# Patient Record
Sex: Female | Born: 1955 | Race: White | Hispanic: No | Smoking: Never smoker
Health system: Southern US, Community
[De-identification: ages and names within clinical notes are randomized; demographics above are authoritative.]

## PROBLEM LIST (undated history)

## (undated) DIAGNOSIS — E119 Type 2 diabetes mellitus without complications: Secondary | ICD-10-CM

## (undated) DIAGNOSIS — E7800 Pure hypercholesterolemia, unspecified: Secondary | ICD-10-CM

## (undated) DIAGNOSIS — I10 Essential (primary) hypertension: Secondary | ICD-10-CM

## (undated) DIAGNOSIS — M1990 Unspecified osteoarthritis, unspecified site: Secondary | ICD-10-CM

## (undated) DIAGNOSIS — F418 Other specified anxiety disorders: Secondary | ICD-10-CM

## (undated) HISTORY — DX: Other specified anxiety disorders: F41.8

## (undated) HISTORY — DX: Unspecified osteoarthritis, unspecified site: M19.90

## (undated) HISTORY — PX: TUBAL LIGATION: SHX77

---

## 2003-06-06 ENCOUNTER — Ambulatory Visit (HOSPITAL_COMMUNITY): Admission: RE | Admit: 2003-06-06 | Discharge: 2003-06-06 | Payer: Self-pay | Admitting: Family Medicine

## 2004-08-26 ENCOUNTER — Ambulatory Visit (HOSPITAL_COMMUNITY): Admission: RE | Admit: 2004-08-26 | Discharge: 2004-08-26 | Payer: Self-pay | Admitting: Urology

## 2004-10-06 ENCOUNTER — Ambulatory Visit (HOSPITAL_COMMUNITY): Admission: RE | Admit: 2004-10-06 | Discharge: 2004-10-06 | Payer: Self-pay | Admitting: Family Medicine

## 2008-09-04 ENCOUNTER — Ambulatory Visit (HOSPITAL_COMMUNITY): Admission: RE | Admit: 2008-09-04 | Discharge: 2008-09-04 | Payer: Self-pay | Admitting: Family Medicine

## 2010-09-11 ENCOUNTER — Other Ambulatory Visit (HOSPITAL_COMMUNITY): Payer: Self-pay | Admitting: Family Medicine

## 2010-09-11 DIAGNOSIS — Z139 Encounter for screening, unspecified: Secondary | ICD-10-CM

## 2010-09-15 ENCOUNTER — Ambulatory Visit (HOSPITAL_COMMUNITY): Payer: Self-pay

## 2010-09-19 ENCOUNTER — Ambulatory Visit (HOSPITAL_COMMUNITY): Payer: Self-pay

## 2010-11-21 NOTE — Consult Note (Signed)
NAME:  Ashley Oneal, Ashley Oneal NO.:  [id number]   MEDICAL RECORD NO.:  [id number]          PATIENT TYPE:   LOCATION:                                 FACILITY:   PHYSICIAN:  Ky Barban, M.D.    DATE OF BIRTH:   DATE OF CONSULTATION:  DATE OF DISCHARGE:                                   CONSULTATION   CHIEF COMPLAINT:  Stress urinary incontinence.   HISTORY OF PRESENT ILLNESS:  A 55 year old female who was evaluated by me on  January 26 with marked stress incontinence after she was referred by Dr.  Janna Arch.  She said that this incontinence is going on for the last one year  or more.  Also, there is urgency urge incontinence but no dysuria,  hematuria, fever or chills.  She has been taking Ditropan but without any  help.  A workup shows that she can benefit from bladder neck suspension.  I  advised her to undergo a tension-free vaginal tape.  I described the  procedure in detail, its limitation, complications, especially urinary  retention leading to removal of the tape.  She understands and wanted me to  go ahead and schedule.  I also told her the urgency will probably continue  and she will have to still take Ditropan.   PAST HISTORY:  1.  Tubal ligation.  2.  Ulcers.  3.  Hypertension.  4.  Suffers from depression.   MEDICATIONS:  Lotrel, Effexor, Ditropan XL.   PERSONAL HISTORY:  She does not smoke but drinks two cans of beer every  days.   REVIEW OF SYSTEMS:  Unremarkable.   PHYSICAL EXAMINATION:  VITAL SIGNS:  Blood pressure 112/94, temperature is  96.8.  CENTRAL NERVOUS SYSTEM:  No gross neurologic deficit.  HEAD AND NECK, ENT:  Negative.  CHEST:  Symmetrical.  CARDIAC:  Regular sinus rhythm, no murmur.  ABDOMEN:  Soft, flat.  Liver, spleen, kidneys not palpable.  No CVA  tenderness.  PELVIC:  No adnexal mass or tenderness.   IMPRESSION:  1.  Stress urinary incontinence.  2.  Hypertension.  3.  Depression.   PLAN:  Tension-free vaginal  tape under anesthesia as outpatient.      MIJ/MEDQ  D:  08/25/2004  T:  08/25/2004  Job:  [id number]   cc:   [facility]  Fax: [phone]

## 2010-11-21 NOTE — Op Note (Signed)
NAMEJALENA, Ashley Oneal NO.:  [id number]   MEDICAL RECORD NO.:  [id number]          PATIENT TYPE:  AMB   LOCATION:  DAY                           FACILITY:  [facility]   PHYSICIAN:  Ky Barban, M.D.DATE OF BIRTH:  March 01, 1956   DATE OF PROCEDURE:  08/26/2004  DATE OF DISCHARGE:                                 OPERATIVE REPORT   PREOPERATIVE DIAGNOSIS:  Stress urinary incontinence.   POSTOPERATIVE DIAGNOSES:  Stress urinary incontinence.   PROCEDURE:  Tension-free vaginal tape.   ANESTHESIA:  General.   PROCEDURE:  The patient was given general endotracheal anesthesia, placed in  lithotomy position. After prep and drape, #20 Foley catheter placed into the  bladder, and the suprapubic site at the pubic tubercle level was marked on  each side of the midline. The base of the mid urethra was infiltrated with  15 mL of normal saline, and incision right over the urethra ws made, ending  about 1.5 cm proximal to the meatus, and periurethral area was developed so  that it could accommodate the tip of the trocar. At this point, Foley  catheter was removed, and vaginal speculum was also removed, and #20 Foley  catheter with a trocar was introduced into the bladder deflecting the  bladder neck towards the right side. Trocar was introduced at the level of  the mid urethra on the left side, and it was passed into the retropubic  space, came out at the suprapubic area of previously marked site. Another  trocar was then placed. Foley catheter was removed. Bladder was inspected  with right angle lens to make sure the trocar was outside the bladder, and  the bladder was filled up with about 300 mL of saline. Now, the bladder was  emptied, and the trocar was pulled along with the blue cord in the  suprapubic area. The part of the blue cord was left on the left side, and  the extra part with the trocar was removed, and then the trocar was  reintroduced on the right side in the  same fashion, and in the same way, the  bladder was checked to make sure the trocar was outside the bladder. The  blue cord on the right side is pulled in the suprapubic area. Now I was  ready to insert the tape. The end of the tape was tied to the vaginal end of  the blue cord, and the tape ends pulled in the suprapubic area by pulling  the blue cord. A #20 Foley catheter was then placed. The tape was positioned  over the mid urethra, and it was left loose. Then, I introduced the Mayo  scissors in between the urethra and the tape. The plastic covering the tube  was separated, and the plastic sheath was removed gently, making sure the  tape was not tight, and I had already removed redundancy from the tape. The  tape was lying gently against the mid urethra. The vaginal wound was  irrigated and closed with 3 stitches  of 3-0 Vicryl. Once again, I inspected the bladder to make sure the tape  was  outside the bladder. At this point, the bladder was emptied. The redundant  part of the tape in the suprapubic area was cut, and the wound was simply  covered with a Band-Aid. The patient left the operating room in satisfactory  condition.      MIJ/MEDQ  D:  08/26/2004  T:  08/26/2004  Job:  [id number]

## 2011-11-18 ENCOUNTER — Telehealth: Payer: Self-pay | Admitting: *Deleted

## 2011-11-18 NOTE — Telephone Encounter (Signed)
Opened in error

## 2013-08-02 ENCOUNTER — Encounter (HOSPITAL_COMMUNITY): Payer: Self-pay | Admitting: Emergency Medicine

## 2013-08-02 ENCOUNTER — Emergency Department (HOSPITAL_COMMUNITY)
Admission: EM | Admit: 2013-08-02 | Discharge: 2013-08-03 | Disposition: A | Discharge status: Home / Self Care | Payer: Medicaid Other | Attending: Emergency Medicine | Admitting: Emergency Medicine

## 2013-08-02 ENCOUNTER — Emergency Department (HOSPITAL_COMMUNITY): Payer: Medicaid Other

## 2013-08-02 DIAGNOSIS — R42 Dizziness and giddiness: Secondary | ICD-10-CM | POA: Insufficient documentation

## 2013-08-02 DIAGNOSIS — Z862 Personal history of diseases of the blood and blood-forming organs and certain disorders involving the immune mechanism: Secondary | ICD-10-CM | POA: Insufficient documentation

## 2013-08-02 DIAGNOSIS — J069 Acute upper respiratory infection, unspecified: Secondary | ICD-10-CM | POA: Insufficient documentation

## 2013-08-02 DIAGNOSIS — R000 Tachycardia, unspecified: Secondary | ICD-10-CM | POA: Insufficient documentation

## 2013-08-02 DIAGNOSIS — Z8639 Personal history of other endocrine, nutritional and metabolic disease: Secondary | ICD-10-CM | POA: Insufficient documentation

## 2013-08-02 DIAGNOSIS — I10 Essential (primary) hypertension: Secondary | ICD-10-CM | POA: Insufficient documentation

## 2013-08-02 DIAGNOSIS — R112 Nausea with vomiting, unspecified: Secondary | ICD-10-CM | POA: Insufficient documentation

## 2013-08-02 HISTORY — DX: Essential (primary) hypertension: I10

## 2013-08-02 HISTORY — DX: Pure hypercholesterolemia, unspecified: E78.00

## 2013-08-02 LAB — PRO B NATRIURETIC PEPTIDE: Pro B Natriuretic peptide (BNP): 9 pg/mL (ref 0–125)

## 2013-08-02 LAB — CBC
HCT: 42.8 % (ref 36.0–46.0)
Hemoglobin: 14.3 g/dL (ref 12.0–15.0)
MCH: 30.8 pg (ref 26.0–34.0)
MCHC: 33.4 g/dL (ref 30.0–36.0)
MCV: 92 fL (ref 78.0–100.0)
Platelets: 299 10*3/uL (ref 150–400)
RBC: 4.65 MIL/uL (ref 3.87–5.11)
RDW: 12.9 % (ref 11.5–15.5)
WBC: 15.2 10*3/uL — ABNORMAL HIGH (ref 4.0–10.5)

## 2013-08-02 LAB — COMPREHENSIVE METABOLIC PANEL
ALT: 59 U/L — ABNORMAL HIGH (ref 0–35)
AST: 53 U/L — ABNORMAL HIGH (ref 0–37)
Albumin: 4.1 g/dL (ref 3.5–5.2)
Alkaline Phosphatase: 133 U/L — ABNORMAL HIGH (ref 39–117)
BUN: 19 mg/dL (ref 6–23)
CO2: 23 mEq/L (ref 19–32)
Calcium: 8.8 mg/dL (ref 8.4–10.5)
Chloride: 103 mEq/L (ref 96–112)
Creatinine, Ser: 0.86 mg/dL (ref 0.50–1.10)
GFR calc Af Amer: 84 mL/min — ABNORMAL LOW (ref >90)
GFR calc non Af Amer: 73 mL/min — ABNORMAL LOW (ref >90)
Glucose, Bld: 127 mg/dL — ABNORMAL HIGH (ref 70–99)
Potassium: 3.7 mEq/L (ref 3.7–5.3)
Sodium: 142 mEq/L (ref 137–147)
Total Bilirubin: 0.5 mg/dL (ref 0.3–1.2)
Total Protein: 7.4 g/dL (ref 6.0–8.3)

## 2013-08-02 LAB — PROTIME-INR
INR: 0.97 (ref 0.00–1.49)
Prothrombin Time: 12.7 seconds (ref 11.6–15.2)

## 2013-08-02 LAB — TROPONIN I: Troponin I: <0.3 ng/mL (ref <0.30)

## 2013-08-02 LAB — D-DIMER, QUANTITATIVE: D-Dimer, Quant: 0.46 ug/mL-FEU (ref 0.00–0.48)

## 2013-08-02 MED ORDER — SODIUM CHLORIDE 0.9 % IV SOLN: 1000.0000 mL | INTRAVENOUS | Status: DC

## 2013-08-02 MED ORDER — ASPIRIN 81 MG PO CHEW: 324.0000 mg | CHEWABLE_TABLET | Freq: Once | ORAL | Status: DC

## 2013-08-02 MED ORDER — SODIUM CHLORIDE 0.9 % IV SOLN
1000.0000 mL | Freq: Once | INTRAVENOUS | Status: AC
  Administered 2013-08-02: 1000 mL via INTRAVENOUS

## 2013-08-02 MED ORDER — GUAIFENESIN-CODEINE 100-10 MG/5ML PO SOLN: 5.0000 mL | Freq: Three times a day (TID) | ORAL | Status: AC | PRN

## 2013-08-02 MED ORDER — LEVOFLOXACIN 500 MG PO TABS: 500.0000 mg | ORAL_TABLET | Freq: Every day | ORAL | Status: AC

## 2013-08-02 MED ORDER — LEVOFLOXACIN 500 MG PO TABS
500.0000 mg | ORAL_TABLET | Freq: Once | ORAL | Status: AC
  Administered 2013-08-02: 500 mg via ORAL
  Filled 2013-08-02: qty 1

## 2013-08-02 MED ORDER — ASPIRIN 81 MG PO CHEW
324.0000 mg | CHEWABLE_TABLET | Freq: Once | ORAL | Status: AC
  Administered 2013-08-02: 324 mg via ORAL
  Filled 2013-08-02: qty 4

## 2013-08-02 NOTE — ED Notes (Signed)
Pt c/o central chest pain since 1600 today. Pt took tylenol and nitro was given enroute by ems.

## 2013-08-02 NOTE — ED Provider Notes (Signed)
CSN: [id number]     Arrival date & time 08/02/13  2109 History  This chart was scribed for Ashley Muskrat, MD by Zettie Pho, ED Scribe. This patient was seen in room [facility] and the patient's care was started at 9:29 PM.    Chief Complaint  Patient presents with  . Chest Pain   The history is provided by the patient. No language interpreter was used.   HPI Comments: Ashley Oneal is a 58 y.o. Female with a history of HTN and hypercholesterolemia brought in by EMS who presents to the Emergency Department complaining of an intermittent, sharp pain to the central region of the chest onset about 5.5 hours ago while at rest. Patient states that this type of pain is new for her. She denies any pain radiation. She reports some associated shortness of breath secondary to the pain and states that the pain is exacerbated with deep breathing. She reports some associated nausea, emesis, dizziness. Patient reports taking Tylenol at home without relief. Patient was given nitro en route by EMS. She denies any recent changes in medications. She denies syncope, abdominal pain, sore throat, difficulty speaking. Patient has no other pertinent medical history. Patient does not drink or smoke.   Past Medical History  Diagnosis Date  . Hypertension   . Hypercholesteremia    Past Surgical History  Procedure Laterality Date  . Tubal ligation     No family history on file. History  Substance Use Topics  . Smoking status: Never Smoker   . Smokeless tobacco: Not on file  . Alcohol Use: No   OB History   Grav Para Term Preterm Abortions TAB SAB Ect Mult Living                 Review of Systems  Constitutional:       Per HPI, otherwise negative  HENT: Negative for sore throat.        Per HPI, otherwise negative  Respiratory: Positive for shortness of breath.        Per HPI, otherwise negative  Cardiovascular: Positive for chest pain.       Per HPI, otherwise negative  Gastrointestinal: Positive  for nausea and vomiting. Negative for abdominal pain.  Endocrine:       Negative aside from HPI  Genitourinary:       Neg aside from HPI   Musculoskeletal:       Per HPI, otherwise negative  Skin: Negative.   Neurological: Positive for dizziness. Negative for syncope and speech difficulty.    Allergies  Review of patient's allergies indicates no known allergies.  Home Medications  No current outpatient prescriptions on file.  Triage Vitals: BP 105/56  Pulse 126  Temp(Src) 98.3 F (36.8 C)  Resp 20  Ht 5\' 2"  (1.575 m)  Wt 255 lb (115.667 kg)  BMI 46.63 kg/m2  SpO2 99%  Physical Exam  Nursing note and vitals reviewed. Constitutional: She is oriented to person, place, and time. She appears well-developed and well-nourished. No distress.  HENT:  Head: Normocephalic and atraumatic.  Eyes: Conjunctivae and EOM are normal.  Cardiovascular: Regular rhythm.   Tachycardic.   Pulmonary/Chest: Effort normal and breath sounds normal. No stridor. No respiratory distress. She exhibits tenderness.  Tenderness to palpation to the central chest.   Abdominal: She exhibits no distension. There is no tenderness. There is no rebound and no guarding.  Musculoskeletal: She exhibits no edema.  Neurological: She is alert and oriented to person, place, and time.  No cranial nerve deficit.  Skin: Skin is warm and dry.  Psychiatric: She has a normal mood and affect.    ED Course  Procedures (including critical care time)  COORDINATION OF CARE: 9:32 PM- Will order blood labs, EKG, and a chest x-ray. Will order IV fluids and aspirin to manage symptoms. Discussed treatment plan with patient at bedside and patient verbalized agreement.     Labs Review Labs Reviewed  CBC - Abnormal; Notable for the following:    WBC 15.2 (*)    All other components within normal limits  COMPREHENSIVE METABOLIC PANEL - Abnormal; Notable for the following:    Glucose, Bld 127 (*)    AST 53 (*)    ALT 59 (*)     Alkaline Phosphatase 133 (*)    GFR calc non Af Amer 73 (*)    GFR calc Af Amer 84 (*)    All other components within normal limits  PRO B NATRIURETIC PEPTIDE  PROTIME-INR  D-DIMER, QUANTITATIVE  TROPONIN I   Imaging Review Dg Chest 2 View  08/02/2013   CLINICAL DATA:  Chest pain, shortness of breath  EXAM: CHEST  2 VIEW  COMPARISON:  None.  FINDINGS: Minimal lung base opacities. Cardiomediastinal contours within normal range. No pleural effusion or pneumothorax. No acute osseous finding.  IMPRESSION: Mild lung base opacities, favor atelectasis.   Electronically Signed   By: Carlos Levering M.D.   On: 08/02/2013 22:41     Date: 08/02/2013  Rate: 118  Rhythm: sinus tachycardia  QRS Axis: normal  Intervals: normal  ST/T Wave abnormalities: artefact  Conduction Disutrbances:none  Narrative Interpretation:   Old EKG Reviewed: none available  ABNORMAL   11:34 PM Patient remains tachycardic, 105, though she appears better. Had a lengthy conversation with her and her companion about results.  Given the leukocytosis, abnormal chest x-ray, tachycardia, mild cough, there is some suspicion that this is early presentation of pneumonia versus viral URI.  Patient will be started on empiric antibiotics for presumed pneumonia given the aforementioned findings. MDM    I personally performed the services described in this documentation, which was scribed in my presence. The recorded information has been reviewed and is accurate.   This patient presents with several hours of chest discomfort, cough, fatigue.  With the duration of symptoms, the patient's normal troponin, in addition to the unremarkable EKG is reassuring for the low suspicion of ongoing coronary ischemia.  Patient has negative d-dimer. With leukocytosis, tachycardia, cough, there suspicion for infection.  Patient is awake, alert, oriented.  Given this, her generally good health prior to this illness, she was stable for discharge  after initiation of antibiotic therapy. She has a primary care physician whom she will contact tomorrow to insure appropriate ongoing management.     Ashley Muskrat, MD 08/02/13 [phone]

## 2013-08-02 NOTE — Discharge Instructions (Signed)
As discussed, your evaluation tonight is most consistent with an episode of respiratory infection, possibly early pneumonia.   It is important that you continue to have your condition monitored and managed by your primary care physician.  Please take medication as directed, and call tomorrow to arrange appropriate ongoing followup care.  Return here for concerning changes in your condition.

## 2016-11-07 ENCOUNTER — Encounter (HOSPITAL_COMMUNITY): Payer: Self-pay | Admitting: *Deleted

## 2016-11-07 ENCOUNTER — Emergency Department (HOSPITAL_COMMUNITY)
Admission: EM | Admit: 2016-11-07 | Discharge: 2016-11-07 | Disposition: A | Discharge status: Home / Self Care | Payer: BLUE CROSS/BLUE SHIELD | Attending: Emergency Medicine | Admitting: Emergency Medicine

## 2016-11-07 DIAGNOSIS — I10 Essential (primary) hypertension: Secondary | ICD-10-CM | POA: Insufficient documentation

## 2016-11-07 DIAGNOSIS — T63441A Toxic effect of venom of bees, accidental (unintentional), initial encounter: Secondary | ICD-10-CM | POA: Insufficient documentation

## 2016-11-07 DIAGNOSIS — E119 Type 2 diabetes mellitus without complications: Secondary | ICD-10-CM | POA: Diagnosis not present

## 2016-11-07 DIAGNOSIS — Z79899 Other long term (current) drug therapy: Secondary | ICD-10-CM | POA: Insufficient documentation

## 2016-11-07 DIAGNOSIS — R21 Rash and other nonspecific skin eruption: Secondary | ICD-10-CM | POA: Diagnosis present

## 2016-11-07 HISTORY — DX: Type 2 diabetes mellitus without complications: E11.9

## 2016-11-07 MED ORDER — DIPHENHYDRAMINE HCL 25 MG PO CAPS: 25.0000 mg | ORAL_CAPSULE | Freq: Four times a day (QID) | ORAL | 0 refills | Status: AC | PRN

## 2016-11-07 MED ORDER — IBUPROFEN 600 MG PO TABS: 600.0000 mg | ORAL_TABLET | Freq: Four times a day (QID) | ORAL | 0 refills | Status: AC | PRN

## 2016-11-07 MED ORDER — DIPHENHYDRAMINE HCL 25 MG PO CAPS
25.0000 mg | ORAL_CAPSULE | Freq: Once | ORAL | Status: AC
  Administered 2016-11-07: 25 mg via ORAL

## 2016-11-07 NOTE — Discharge Instructions (Signed)
I recommend taking benadryl and ibuprofen as prescribed for your allergic reaction.  Ice pack application can also help with pain and itching.

## 2016-11-07 NOTE — ED Notes (Signed)
JI in to assess 

## 2016-11-07 NOTE — ED Triage Notes (Signed)
Pt reports pain, redness, and selling to right lower forearm. Pt also reports itching to the site.

## 2016-11-07 NOTE — ED Notes (Signed)
Pt reports recent bee sting- awakened this am with swelling and redness, itching and burning to area near bee sting. Pt reports has never reacted like this before.

## 2016-11-09 NOTE — ED Provider Notes (Signed)
[facility] DEPT Provider Note   CSN: [id number] Arrival date & time: 11/07/16  2120     History   Chief Complaint Chief Complaint  Patient presents with  . Rash    HPI Ashley Oneal is a 61 y.o. female with a history of borderline DM, diet controlled, and HTN presenting with pain, swelling and itching at the site of a bee sting which occurred yesterday.  She was sitting on the front porch when she was bit by a yellow insect, unsure if it was a bee, wasp or yellow jacket. However, denies h/o allergic reaction to these insects, but does not typically react with such a large area of redness and swelling as currently.  She denies sob, cough, wheezing, mouth,tongue or throat tingling or swelling. She has had no treatment prior to arrival.  HPI  Past Medical History:  Diagnosis Date  . Diabetes mellitus without complication (HCC)    borderline  . Hypercholesteremia   . Hypertension     There are no active problems to display for this patient.   Past Surgical History:  Procedure Laterality Date  . TUBAL LIGATION      OB History    No data available       Home Medications    Prior to Admission medications   Medication Sig Start Date End Date Taking? Authorizing Provider  acetaminophen (TYLENOL) 500 MG tablet Take 500 mg by mouth every 6 (six) hours as needed.    [provider]  amLODipine-benazepril (LOTREL) 10-20 MG per capsule Take 1 capsule by mouth daily.    [provider]  colesevelam (WELCHOL) 625 MG tablet Take 1,875 mg by mouth 2 (two) times daily with a meal.    [provider]  diphenhydrAMINE (BENADRYL) 25 mg capsule Take 1 capsule (25 mg total) by mouth every 6 (six) hours as needed for itching. 11/07/16   Evalee Jefferson, PA-C  guaiFENesin-codeine 100-10 MG/5ML syrup Take 5 mLs by mouth 3 (three) times daily as needed for cough. 08/02/13   Carmin Muskrat, MD  hydrOXYzine (ATARAX/VISTARIL) 25 MG tablet Take 25 mg by mouth 2 (two)  times daily.    [provider]  ibuprofen (ADVIL,MOTRIN) 600 MG tablet Take 1 tablet (600 mg total) by mouth every 6 (six) hours as needed. 11/07/16   Evalee Jefferson, PA-C  meloxicam (MOBIC) 15 MG tablet Take 15 mg by mouth daily.    [provider]    Family History History reviewed. No pertinent family history.  Social History Social History  Substance Use Topics  . Smoking status: Never Smoker  . Smokeless tobacco: Never Used  . Alcohol use No     Allergies   Patient has no known allergies.   Review of Systems Review of Systems  Constitutional: Negative for chills and fever.  HENT: Negative for congestion.   Respiratory: Negative for cough, shortness of breath, wheezing and stridor.   Skin: Positive for color change and wound.  Neurological: Negative for numbness.     Physical Exam Updated Vital Signs BP 113/78 (BP Location: Left Arm)   Pulse 92   Temp 98.2 F (36.8 C) (Oral)   Resp 20   Ht 5' (1.524 m)   Wt 93.9 kg   SpO2 95%   BMI 40.43 kg/m   Physical Exam  Constitutional: She appears well-developed and well-nourished. No distress.  HENT:  Head: Normocephalic.  Mouth/Throat: Oropharynx is clear and moist.  Neck: Neck supple.  Cardiovascular: Normal rate.   Pulmonary/Chest:  Effort normal. No stridor. She has no wheezes.  Musculoskeletal: Normal range of motion. She exhibits no edema.  Lymphadenopathy:    She has no axillary adenopathy.       Right axillary: No pectoral and no lateral adenopathy present.       Right: No epitrochlear adenopathy present.  Skin: There is erythema.  6 cm area of slightly raised erythematous plaque right medial forearm with clearly demarcated borders with a small excoriated bite site.  No fb, no red streaking. Slightly increased warmth but not hot to touch. Forearm is soft.     ED Treatments / Results  Labs (all labs ordered are listed, but only abnormal results are displayed) Labs Reviewed - No data to  display  EKG  EKG Interpretation None       Radiology No results found.  Procedures Procedures (including critical care time)  Medications Ordered in ED Medications  diphenhydrAMINE (BENADRYL) capsule 25 mg (25 mg Oral Given 11/07/16 2239)     Initial Impression / Assessment and Plan / ED Course  I have reviewed the triage vital signs and the nursing notes.  Pertinent labs & imaging results that were available during my care of the patient were reviewed by me and considered in my medical decision making (see chart for details).     Pt with localized reaction to insect sting.  Cool compresses, benadryl, ibuprofen advised.  Prn f/u with pcp for any persistent sx.  No evidence of infection or anaphylactic reaction.  Final Clinical Impressions(s) / ED Diagnoses   Final diagnoses:  Bee sting reaction, accidental or unintentional, initial encounter    New Prescriptions Discharge Medication List as of 11/07/2016 10:35 PM    START taking these medications   Details  diphenhydrAMINE (BENADRYL) 25 mg capsule Take 1 capsule (25 mg total) by mouth every 6 (six) hours as needed for itching., Starting Sat 11/07/2016, Print    ibuprofen (ADVIL,MOTRIN) 600 MG tablet Take 1 tablet (600 mg total) by mouth every 6 (six) hours as needed., Starting Sat 11/07/2016, Print         Evalee Jefferson, PA-C 11/09/16 1236    Long, [facility], MD 11/10/16 1135

## 2019-11-20 ENCOUNTER — Other Ambulatory Visit (HOSPITAL_COMMUNITY): Payer: Self-pay | Admitting: Family Medicine

## 2019-11-20 DIAGNOSIS — Z1231 Encounter for screening mammogram for malignant neoplasm of breast: Secondary | ICD-10-CM

## 2019-11-27 ENCOUNTER — Other Ambulatory Visit: Payer: Self-pay

## 2019-11-27 ENCOUNTER — Ambulatory Visit (HOSPITAL_COMMUNITY)
Admission: RE | Admit: 2019-11-27 | Discharge: 2019-11-27 | Disposition: A | Discharge status: Home / Self Care | Payer: Medicare Other | Source: Ambulatory Visit | Attending: Family Medicine | Admitting: Family Medicine

## 2019-11-27 DIAGNOSIS — Z1231 Encounter for screening mammogram for malignant neoplasm of breast: Secondary | ICD-10-CM | POA: Diagnosis not present

## 2019-11-29 ENCOUNTER — Encounter: Payer: Self-pay | Admitting: *Deleted

## 2020-02-06 ENCOUNTER — Telehealth: Payer: Self-pay | Admitting: *Deleted

## 2020-02-06 ENCOUNTER — Encounter: Payer: Self-pay | Admitting: *Deleted

## 2020-02-06 NOTE — Telephone Encounter (Signed)
Mailed letter to pt to inform her that her appointment had to be rescheduled.

## 2020-02-12 ENCOUNTER — Ambulatory Visit: Payer: Medicare Other

## 2020-03-12 ENCOUNTER — Ambulatory Visit (INDEPENDENT_AMBULATORY_CARE_PROVIDER_SITE_OTHER): Payer: Self-pay | Admitting: *Deleted

## 2020-03-12 ENCOUNTER — Other Ambulatory Visit: Payer: Self-pay

## 2020-03-12 DIAGNOSIS — Z1211 Encounter for screening for malignant neoplasm of colon: Secondary | ICD-10-CM

## 2020-03-12 MED ORDER — PEG 3350-KCL-NA BICARB-NACL 420 G PO SOLR: 4000.0000 mL | Freq: Once | ORAL | 0 refills | Status: AC

## 2020-03-12 NOTE — Progress Notes (Signed)
Patient's BMI is 40.5. Technically that makes her ASA III. Based on guidelines she needs ov.

## 2020-03-12 NOTE — Progress Notes (Signed)
Gastroenterology Pre-Procedure Review  Request Date: 03/12/2020 Requesting Physician: Dr. Cindie Laroche, no previous TCS  PATIENT REVIEW QUESTIONS: The patient responded to the following health history questions as indicated:    1. Diabetes Melitis: no 2. Joint replacements in the past 12 months: no 3. Major health problems in the past 3 months: no 4. Has an artificial valve or MVP: no 5. Has a defibrillator: no 6. Has been advised in past to take antibiotics in advance of a procedure like teeth cleaning: no 7. Family history of colon cancer: yes, grandmother: age unknown 24. Alcohol Use: yes, 1 beer daily 9. Illicit drug Use: no 10. History of sleep apnea: no  11. History of coronary artery or other vascular stents placed within the last 12 months: no 12. History of any prior anesthesia complications: no 13. There is no height or weight on file to calculate BMI. ht: 4'10 wt: 194 lbs    MEDICATIONS & ALLERGIES:    Patient reports the following regarding taking any blood thinners:   Plavix? no Aspirin? no Coumadin? no Brilinta? no Xarelto? no Eliquis? no Pradaxa? no Savaysa? no Effient? no  Patient confirms/reports the following medications:  Current Outpatient Medications  Medication Sig Dispense Refill  . acetaminophen (TYLENOL) 500 MG tablet Take 500 mg by mouth as needed.     Marland Kitchen amLODipine-benazepril (LOTREL) 10-20 MG per capsule Take 1 capsule by mouth daily.    . Cholecalciferol (VITAMIN D3) 10 MCG (400 UNIT) CAPS Take by mouth daily.    . diphenhydrAMINE (BENADRYL) 25 mg capsule Take 1 capsule (25 mg total) by mouth every 6 (six) hours as needed for itching. (Patient taking differently: Take 25 mg by mouth as needed for itching. ) 30 capsule 0  . guaiFENesin-codeine 100-10 MG/5ML syrup Take 5 mLs by mouth 3 (three) times daily as needed for cough. 120 mL 0  . hydrOXYzine (ATARAX/VISTARIL) 25 MG tablet Take 25 mg by mouth 2 (two) times daily.    Marland Kitchen ibuprofen (ADVIL,MOTRIN) 600 MG  tablet Take 1 tablet (600 mg total) by mouth every 6 (six) hours as needed. (Patient taking differently: Take 600 mg by mouth as needed. ) 30 tablet 0  . Melatonin 10 MG TABS Take by mouth at bedtime.    . meloxicam (MOBIC) 15 MG tablet Take 15 mg by mouth daily.    Marland Kitchen PARoxetine (PAXIL) 20 MG tablet Take 20 mg by mouth every morning.    . simvastatin (ZOCOR) 10 MG tablet Take 10 mg by mouth daily.    . colesevelam (WELCHOL) 625 MG tablet Take 1,875 mg by mouth 2 (two) times daily with a meal. (Patient not taking: Reported on 03/12/2020)     No current facility-administered medications for this visit.    Patient confirms/reports the following allergies:  No Known Allergies  No orders of the defined types were placed in this encounter.   AUTHORIZATION INFORMATION Primary Insurance: [employer],  ID #: [id number],  Group #: [id number] Pre-Cert / Auth required: No, not required  SCHEDULE INFORMATION: Procedure has been scheduled as follows:  Date: 04/08/2020, Time: 7:30 Location: [facility] with Dr. Abbey Chatters  This Gastroenterology Pre-Precedure Review Form is being routed to the following provider(s): Neil Crouch, PA

## 2020-03-12 NOTE — Patient Instructions (Signed)
Ashley Oneal   04-12-1956 MRN: [id number]    Procedure Date: 04/08/2020 Time to register: 6:00 am Place to register: [facility] Procedure Time: 7:30 am Scheduled provider: Dr. Abbey Chatters  PREPARATION FOR COLONOSCOPY WITH TRI-LYTE SPLIT PREP  Please notify us immediately if you are diabetic, take iron supplements, or if you are on Coumadin or any other blood thinners.   Please hold the following medications: n/a  You will need to purchase 1 fleet enema and 1 box of Bisacodyl $RemoveBefo'5mg'nZumyamttDo$  tablets.   2 DAYS BEFORE PROCEDURE:  DATE: 04/06/2020   DAY: Saturday Begin clear liquid diet AFTER your lunch meal. NO SOLID FOODS after this point.  1 DAY BEFORE PROCEDURE:  DATE: 04/07/2020   DAY: Sunday Continue clear liquids the entire day - NO SOLID FOOD.   Diabetic medications adjustments for today: n/a  At 2:00 pm:  Take 2 Bisacodyl tablets.   At 4:00pm:  Start drinking your solution. Make sure you mix well per instructions on the bottle. Try to drink 1 (one) 8 ounce glass every 10-15 minutes until you have consumed HALF the jug. You should complete by 6:00pm.You must keep the left over solution refrigerated until completed next day.  Continue clear liquids. You must drink plenty of clear liquids to prevent dehyration and kidney failure.     DAY OF PROCEDURE:   DATE: 04/08/2020   DAY: Monday If you take medications for your heart, blood pressure or breathing, you may take these medications.  Diabetic medications adjustments for today: n/a  Five hours before your procedure time @ 2:30 am:  Finish remaining amout of bowel prep, drinking 1 (one) 8 ounce glass every 10-15 minutes until complete. You have two hours to consume remaining prep.   Three hours before your procedure time @ 4:30 am:  Nothing by mouth.   At least one hour before going to the hospital:  Give yourself one Fleet enema. You may take your morning medications with sip of water unless we have instructed otherwise.       Please see below for Dietary Information.  CLEAR LIQUIDS INCLUDE:  Water Jello (NOT red in color)   Ice Popsicles (NOT red in color)   Tea (sugar ok, no milk/cream) Powdered fruit flavored drinks  Coffee (sugar ok, no milk/cream) Gatorade/ Lemonade/ Kool-Aid  (NOT red in color)   Juice: apple, white grape, white cranberry Soft drinks  Clear bullion, consomme, broth (fat free beef/chicken/vegetable)  Carbonated beverages (any kind)  Strained chicken noodle soup Hard Candy   Remember: Clear liquids are liquids that will allow you to see your fingers on the other side of a clear glass. Be sure liquids are NOT red in color, and not cloudy, but CLEAR.  DO NOT EAT OR DRINK ANY OF THE FOLLOWING:  Dairy products of any kind   Cranberry juice Tomato juice / V8 juice   Grapefruit juice Orange juice     Red grape juice  Do not eat any solid foods, including such foods as: cereal, oatmeal, yogurt, fruits, vegetables, creamed soups, eggs, bread, crackers, pureed foods in a blender, etc.   HELPFUL HINTS FOR DRINKING PREP SOLUTION:   Make sure prep is extremely cold. Mix and refrigerate the the morning of the prep. You may also put in the freezer.   You may try mixing some Crystal Light or Country Time Lemonade if you prefer. Mix in small amounts; add more if necessary.  Try drinking through a straw  Rinse mouth with water  or a mouthwash between glasses, to remove after-taste.  Try sipping on a cold beverage /ice/ popsicles between glasses of prep.  Place a piece of sugar-free hard candy in mouth between glasses.  If you become nauseated, try consuming smaller amounts, or stretch out the time between glasses. Stop for 30-60 minutes, then slowly start back drinking.        OTHER INSTRUCTIONS  You will need a responsible adult at least 65 years of age to accompany you and drive you home. This person must remain in the waiting room during your procedure. The hospital will cancel  your procedure if you do not have a responsible adult with you.   1. Wear loose fitting clothing that is easily removed. 2. Leave jewelry and other valuables at home.  3. Remove all body piercing jewelry and leave at home. 4. Total time from sign-in until discharge is approximately 2-3 hours. 5. You should go home directly after your procedure and rest. You can resume normal activities the day after your procedure. 6. The day of your procedure you should not:  Drive  Make legal decisions  Operate machinery  Drink alcohol  Return to work   You may call the office (Dept: [phone]) before 5:00pm, or page the doctor on call [phone]) after 5:00pm, for further instructions, if necessary.   Insurance Information YOU WILL NEED TO CHECK WITH YOUR INSURANCE COMPANY FOR THE BENEFITS OF COVERAGE YOU HAVE FOR THIS PROCEDURE.  UNFORTUNATELY, NOT ALL INSURANCE COMPANIES HAVE BENEFITS TO COVER ALL OR PART OF THESE TYPES OF PROCEDURES.  IT IS YOUR RESPONSIBILITY TO CHECK YOUR BENEFITS, HOWEVER, WE WILL BE GLAD TO ASSIST YOU WITH ANY CODES YOUR INSURANCE COMPANY MAY NEED.    PLEASE NOTE THAT MOST INSURANCE COMPANIES WILL NOT COVER A SCREENING COLONOSCOPY FOR PEOPLE UNDER THE AGE OF 50  IF YOU HAVE [employer] INSURANCE, YOU MAY HAVE BENEFITS FOR A SCREENING COLONOSCOPY BUT IF POLYPS ARE FOUND THE DIAGNOSIS WILL CHANGE AND THEN YOU MAY HAVE A DEDUCTIBLE THAT WILL NEED TO BE MET. SO PLEASE MAKE SURE YOU CHECK YOUR BENEFITS FOR A SCREENING COLONOSCOPY AS WELL AS A DIAGNOSTIC COLONOSCOPY.

## 2020-03-13 NOTE — Progress Notes (Signed)
Called pt and informed her that we needed to cancel her procedure.  She was informed that she needed an ov with a provider first.  Pt scheduled ov for 03/18/2020 at 2:00 with Aliene Altes, PA.

## 2020-03-14 ENCOUNTER — Ambulatory Visit: Payer: Medicare Other

## 2020-03-16 NOTE — Progress Notes (Deleted)
Referring Provider:*** Primary Care Physician:  Joyice Faster, FNP Primary Gastroenterologist:  Dr. Abbey Chatters  No chief complaint on file.   HPI:   Ashley Oneal is a 64 y.o. female presenting today at the request of Lucia Gaskins, MD for colonoscopy.  She was revealing in her triage visit but due to BMI meeting ASA 3 guidelines, patient was scheduled for office visit.  Today:   Past Medical History:  Diagnosis Date  . Diabetes mellitus without complication (HCC)    borderline  . Hypercholesteremia   . Hypertension     Past Surgical History:  Procedure Laterality Date  . TUBAL LIGATION      Current Outpatient Medications  Medication Sig Dispense Refill  . acetaminophen (TYLENOL) 500 MG tablet Take 500 mg by mouth as needed.     Marland Kitchen amLODipine-benazepril (LOTREL) 10-20 MG per capsule Take 1 capsule by mouth daily.    . Cholecalciferol (VITAMIN D3) 10 MCG (400 UNIT) CAPS Take by mouth daily.    . colesevelam (WELCHOL) 625 MG tablet Take 1,875 mg by mouth 2 (two) times daily with a meal. (Patient not taking: Reported on 03/12/2020)    . diphenhydrAMINE (BENADRYL) 25 mg capsule Take 1 capsule (25 mg total) by mouth every 6 (six) hours as needed for itching. (Patient taking differently: Take 25 mg by mouth as needed for itching. ) 30 capsule 0  . guaiFENesin-codeine 100-10 MG/5ML syrup Take 5 mLs by mouth 3 (three) times daily as needed for cough. 120 mL 0  . hydrOXYzine (ATARAX/VISTARIL) 25 MG tablet Take 25 mg by mouth 2 (two) times daily.    Marland Kitchen ibuprofen (ADVIL,MOTRIN) 600 MG tablet Take 1 tablet (600 mg total) by mouth every 6 (six) hours as needed. (Patient taking differently: Take 600 mg by mouth as needed. ) 30 tablet 0  . Melatonin 10 MG TABS Take by mouth at bedtime.    . meloxicam (MOBIC) 15 MG tablet Take 15 mg by mouth daily.    Marland Kitchen PARoxetine (PAXIL) 20 MG tablet Take 20 mg by mouth every morning.    . simvastatin (ZOCOR) 10 MG tablet Take 10 mg by mouth daily.       No current facility-administered medications for this visit.    Allergies as of 03/18/2020  . (No Known Allergies)    No family history on file.  Social History   Socioeconomic History  . Marital status: Divorced    Spouse name: Not on file  . Number of children: Not on file  . Years of education: Not on file  . Highest education level: Not on file  Occupational History  . Not on file  Tobacco Use  . Smoking status: Never Smoker  . Smokeless tobacco: Never Used  Vaping Use  . Vaping Use: Never used  Substance and Sexual Activity  . Alcohol use: Yes    Comment: 1 beer daily  . Drug use: No  . Sexual activity: Not on file  Other Topics Concern  . Not on file  Social History Narrative  . Not on file   Social Determinants of Health   Financial Resource Strain:   . Difficulty of Paying Living Expenses: Not on file  Food Insecurity:   . Worried About Charity fundraiser in the Last Year: Not on file  . Ran Out of Food in the Last Year: Not on file  Transportation Needs:   . Lack of Transportation (Medical): Not on file  . Lack of Transportation (Non-Medical):  Not on file  Physical Activity:   . Days of Exercise per Week: Not on file  . Minutes of Exercise per Session: Not on file  Stress:   . Feeling of Stress : Not on file  Social Connections:   . Frequency of Communication with Friends and Family: Not on file  . Frequency of Social Gatherings with Friends and Family: Not on file  . Attends Religious Services: Not on file  . Active Member of Clubs or Organizations: Not on file  . Attends Archivist Meetings: Not on file  . Marital Status: Not on file  Intimate Partner Violence:   . Fear of Current or Ex-Partner: Not on file  . Emotionally Abused: Not on file  . Physically Abused: Not on file  . Sexually Abused: Not on file    Review of Systems: Gen: Denies any fever, chills, fatigue, weight loss, lack of appetite.  CV: Denies chest pain, heart  palpitations, peripheral edema, syncope.  Resp: Denies shortness of breath at rest or with exertion. Denies wheezing or cough.  GI: Denies dysphagia or odynophagia. Denies jaundice, hematemesis, fecal incontinence. GU : Denies urinary burning, urinary frequency, urinary hesitancy MS: Denies joint pain, muscle weakness, cramps, or limitation of movement.  Derm: Denies rash, itching, dry skin Psych: Denies depression, anxiety, memory loss, and confusion Heme: Denies bruising, bleeding, and enlarged lymph nodes.  Physical Exam: There were no vitals taken for this visit. General:   Alert and oriented. Pleasant and cooperative. Well-nourished and well-developed.  Head:  Normocephalic and atraumatic. Eyes:  Without icterus, sclera clear and conjunctiva pink.  Ears:  Normal auditory acuity. Nose:  No deformity, discharge,  or lesions. Mouth:  No deformity or lesions, oral mucosa pink.  Neck:  Supple, without mass or thyromegaly. Lungs:  Clear to auscultation bilaterally. No wheezes, rales, or rhonchi. No distress.  Heart:  S1, S2 present without murmurs appreciated.  Abdomen:  +BS, soft, non-tender and non-distended. No HSM noted. No guarding or rebound. No masses appreciated.  Rectal:  Deferred  Msk:  Symmetrical without gross deformities. Normal posture. Pulses:  Normal pulses noted. Extremities:  Without clubbing or edema. Neurologic:  Alert and  oriented x4;  grossly normal neurologically. Skin:  Intact without significant lesions or rashes. Cervical Nodes:  No significant cervical adenopathy. Psych:  Alert and cooperative. Normal mood and affect.

## 2020-03-18 ENCOUNTER — Ambulatory Visit: Payer: Medicare Other | Admitting: Gastroenterology

## 2020-03-18 ENCOUNTER — Encounter: Payer: Self-pay | Admitting: Internal Medicine

## 2020-03-20 NOTE — Progress Notes (Signed)
Referring Provider: Lucia Gaskins, MD Primary Care Physician:  Lucia Gaskins, MD Primary Gastroenterologist:  Dr. Abbey Chatters  Chief Complaint  Patient presents with  . Colonoscopy    HPI:   Ashley Oneal is a 64 y.o. female presenting today at the request of Lucia Gaskins, MD for colon cancer screening.  Patient was initially a nurse triage.  She reported no prior colonoscopy.  Due to BMI of 40.5 which meets ASA III criteria, she was scheduled for an office visit.  Today:  No abdominal pain. BMs daily. No cosntiaption or diarrhea. No blood in the stool or balck stools. Grandmother had colon cancer. Unknown age at diagnosis. No unintentional weight loss. Has been trying to lose weight. States she is on a diet and is walking. No nausea or vomiting. No GERD symptoms or dysphasia.   Past Medical History:  Diagnosis Date  . Arthritis   . Depression with anxiety   . Diabetes mellitus without complication (HCC)    borderline  . Hypercholesteremia   . Hypertension     Past Surgical History:  Procedure Laterality Date  . TUBAL LIGATION      Current Outpatient Medications  Medication Sig Dispense Refill  . acetaminophen (TYLENOL) 500 MG tablet Take 500 mg by mouth as needed.     Marland Kitchen amLODipine-benazepril (LOTREL) 10-20 MG per capsule Take 1 capsule by mouth daily.    . Cholecalciferol (VITAMIN D3) 10 MCG (400 UNIT) CAPS Take by mouth daily.    . diphenhydrAMINE (BENADRYL) 25 mg capsule Take 1 capsule (25 mg total) by mouth every 6 (six) hours as needed for itching. (Patient taking differently: Take 25 mg by mouth as needed for itching. ) 30 capsule 0  . hydrOXYzine (ATARAX/VISTARIL) 25 MG tablet Take 25 mg by mouth 2 (two) times daily.    Marland Kitchen ibuprofen (ADVIL,MOTRIN) 600 MG tablet Take 1 tablet (600 mg total) by mouth every 6 (six) hours as needed. (Patient taking differently: Take 600 mg by mouth as needed. ) 30 tablet 0  . Melatonin 10 MG TABS Take by mouth at bedtime.    .  meloxicam (MOBIC) 15 MG tablet Take 15 mg by mouth daily.    Marland Kitchen PARoxetine (PAXIL) 20 MG tablet Take 20 mg by mouth every morning.    . simvastatin (ZOCOR) 10 MG tablet Take 10 mg by mouth daily.    Marland Kitchen guaiFENesin-codeine 100-10 MG/5ML syrup Take 5 mLs by mouth 3 (three) times daily as needed for cough. (Patient not taking: Reported on 03/21/2020) 120 mL 0   No current facility-administered medications for this visit.    Allergies as of 03/21/2020  . (No Known Allergies)    Family History  Problem Relation Age of Onset  . Colon cancer Maternal Grandmother        unknown age    Social History   Socioeconomic History  . Marital status: Divorced    Spouse name: Not on file  . Number of children: Not on file  . Years of education: Not on file  . Highest education level: Not on file  Occupational History  . Not on file  Tobacco Use  . Smoking status: Never Smoker  . Smokeless tobacco: Never Used  Vaping Use  . Vaping Use: Never used  Substance and Sexual Activity  . Alcohol use: Yes    Comment: 1 beer daily  . Drug use: No  . Sexual activity: Not on file  Other Topics Concern  . Not on file  Social  History Narrative  . Not on file   Social Determinants of Health   Financial Resource Strain:   . Difficulty of Paying Living Expenses: Not on file  Food Insecurity:   . Worried About Charity fundraiser in the Last Year: Not on file  . Ran Out of Food in the Last Year: Not on file  Transportation Needs:   . Lack of Transportation (Medical): Not on file  . Lack of Transportation (Non-Medical): Not on file  Physical Activity:   . Days of Exercise per Week: Not on file  . Minutes of Exercise per Session: Not on file  Stress:   . Feeling of Stress : Not on file  Social Connections:   . Frequency of Communication with Friends and Family: Not on file  . Frequency of Social Gatherings with Friends and Family: Not on file  . Attends Religious Services: Not on file  . Active  Member of Clubs or Organizations: Not on file  . Attends Archivist Meetings: Not on file  . Marital Status: Not on file  Intimate Partner Violence:   . Fear of Current or Ex-Partner: Not on file  . Emotionally Abused: Not on file  . Physically Abused: Not on file  . Sexually Abused: Not on file    Review of Systems: Gen: Denies any fever, chills, cold or flulike symptoms, presyncope, syncope. CV: Denies chest pain or heart rotations. Resp: Denies shortness of breath or cough. GI: See HPI GU : Denies urinary burning, urinary frequency, urinary hesitancy MS: Has arthritis. Taking meloxicam, ibuprofen, and Tylenol. Psych: History of depression/anxiety/meds controlled as well. Heme: See HPI  Physical Exam: BP 111/76   Pulse 88   Temp (!) 97.3 F (36.3 C) (Temporal)   Ht 4\' 11"  (1.499 m)   Wt 191 lb (86.6 kg)   BMI 38.58 kg/m  General:   Alert and oriented. Pleasant and cooperative. Well-nourished and well-developed.  Head:  Normocephalic and atraumatic. Eyes:  Without icterus, sclera clear and conjunctiva pink.  Ears:  Normal auditory acuity. Lungs:  Clear to auscultation bilaterally. No wheezes, rales, or rhonchi. No distress.  Heart:  S1, S2 present without murmurs appreciated.  Abdomen:  +BS, soft, non-tender and non-distended. No HSM noted. No guarding or rebound. No masses appreciated.  Rectal:  Deferred  Msk:  Symmetrical without gross deformities. Normal posture. Extremities:  Without edema. Neurologic:  Alert and  oriented x4;  grossly normal neurologically. Skin:  Intact without significant lesions or rashes. Psych: Normal mood and affect.

## 2020-03-21 ENCOUNTER — Encounter: Payer: Self-pay | Admitting: *Deleted

## 2020-03-21 ENCOUNTER — Telehealth: Payer: Self-pay | Admitting: *Deleted

## 2020-03-21 ENCOUNTER — Ambulatory Visit (INDEPENDENT_AMBULATORY_CARE_PROVIDER_SITE_OTHER): Payer: Medicare Other | Admitting: Gastroenterology

## 2020-03-21 ENCOUNTER — Encounter: Payer: Self-pay | Admitting: Gastroenterology

## 2020-03-21 ENCOUNTER — Other Ambulatory Visit: Payer: Self-pay

## 2020-03-21 DIAGNOSIS — Z1211 Encounter for screening for malignant neoplasm of colon: Secondary | ICD-10-CM

## 2020-03-21 NOTE — Telephone Encounter (Signed)
LMOVM to schedule TCS with propofol with Dr. Abbey Chatters, ASA 3

## 2020-03-21 NOTE — Assessment & Plan Note (Addendum)
64 year old female with history of borderline diabetes, HTN, HLD, anxiety/depression presenting today to schedule first ever colonoscopy.  No significant upper or lower GI symptoms.  No alarm symptoms.  Reports maternal grandmother with history of colon cancer, unknown age of diagnosis.   Plan: Proceed with colonoscopy with propofol with Dr. Abbey Chatters in the near future. The risks, benefits, and alternatives have been discussed with the patient in detail. The patient states understanding and desires to proceed.  ASA II/III. BMI is coming down but will leave as ASA III for now.  Follow-up as Dr. Abbey Chatters recommends she has no significant GI symptoms at this time.  Advised her to call with any questions or concerns.

## 2020-03-21 NOTE — Patient Instructions (Addendum)
We will get you scheduled for a colonoscopy in the near future with Dr. Abbey Chatters.   Please limit NSAIDs as much as possible. This includes ibuprofen, Meloxicam, advil, aleve, goody powders, and anything that says NSAID on the package.   Do not take meloxicam and ibuprofen together.   You may use Tylenol for arthritis. Be sure not to take more than 3000 mg per day.   We will plan to see you back in the office as Dr. Abbey Chatters recommends. If you have any questions or concerns, do not hesitate to call.   Aliene Altes, PA-C [facility]

## 2020-03-21 NOTE — Telephone Encounter (Signed)
Patient returned call. She has been scheduled for 11-9 at 11:15am. Patient aware will mail pre-op/covid test appt. Confirmed mailing address.

## 2020-04-05 ENCOUNTER — Other Ambulatory Visit (HOSPITAL_COMMUNITY): Payer: Medicare Other

## 2020-05-09 NOTE — Patient Instructions (Addendum)
MARAYA GWILLIAM  05/09/2020     @PREFPERIOPPHARMACY @   Your procedure is scheduled on 05/14/2020.  Report to [facility] at  [place].M.  Call this number if you have problems the morning of surgery:  [phone]   Remember:  Follow the diet and prep instructions given to you by the office.                       Take these medicines the morning of surgery with A SIP OF WATER  Amlodipine, hydroxyzine, mobic(if needed), paxil.    Do not wear jewelry, make-up or nail polish.  Do not wear lotions, powders, or perfumes. Please wear deodorant and brush your teeth.  Do not shave 48 hours prior to surgery.  Men may shave face and neck.  Do not bring valuables to the hospital.  [facility] is not responsible for any belongings or valuables.  Contacts, dentures or bridgework may not be worn into surgery.  Leave your suitcase in the car.  After surgery it may be brought to your room.  For patients admitted to the hospital, discharge time will be determined by your treatment team.  Patients discharged the day of surgery will not be allowed to drive home.   Name and phone number of your driver:   family Special instructions:   DO NOT smoke the morning of your procedure.  Please read over the following fact sheets that you were given. Anesthesia Post-op Instructions and Care and Recovery After Surgery       Colonoscopy, Adult, Care After This sheet gives you information about how to care for yourself after your procedure. Your health care provider may also give you more specific instructions. If you have problems or questions, contact your health care provider. What can I expect after the procedure? After the procedure, it is common to have:  A small amount of blood in your stool for 24 hours after the procedure.  Some gas.  Mild cramping or bloating of your abdomen. Follow these instructions at home: Eating and drinking   Drink enough fluid to keep your urine pale  yellow.  Follow instructions from your health care provider about eating or drinking restrictions.  Resume your normal diet as instructed by your health care provider. Avoid heavy or fried foods that are hard to digest. Activity  Rest as told by your health care provider.  Avoid sitting for a long time without moving. Get up to take short walks every 1-2 hours. This is important to improve blood flow and breathing. Ask for help if you feel weak or unsteady.  Return to your normal activities as told by your health care provider. Ask your health care provider what activities are safe for you. Managing cramping and bloating   Try walking around when you have cramps or feel bloated.  Apply heat to your abdomen as told by your health care provider. Use the heat source that your health care provider recommends, such as a moist heat pack or a heating pad. ? Place a towel between your skin and the heat source. ? Leave the heat on for 20-30 minutes. ? Remove the heat if your skin turns bright red. This is especially important if you are unable to feel pain, heat, or cold. You may have a greater risk of getting burned. General instructions  For the first 24 hours after the procedure: ? Do not drive or use machinery. ?  Do not sign important documents. ? Do not drink alcohol. ? Do your regular daily activities at a slower pace than normal. ? Eat soft foods that are easy to digest.  Take over-the-counter and prescription medicines only as told by your health care provider.  Keep all follow-up visits as told by your health care provider. This is important. Contact a health care provider if:  You have blood in your stool 2-3 days after the procedure. Get help right away if you have:  More than a small spotting of blood in your stool.  Large blood clots in your stool.  Swelling of your abdomen.  Nausea or vomiting.  A fever.  Increasing pain in your abdomen that is not relieved with  medicine. Summary  After the procedure, it is common to have a small amount of blood in your stool. You may also have mild cramping and bloating of your abdomen.  For the first 24 hours after the procedure, do not drive or use machinery, sign important documents, or drink alcohol.  Get help right away if you have a lot of blood in your stool, nausea or vomiting, a fever, or increased pain in your abdomen. This information is not intended to replace advice given to you by your health care provider. Make sure you discuss any questions you have with your health care provider. Document Revised: 01/16/2019 Document Reviewed: 01/16/2019 Elsevier Patient Education  [place] After These instructions provide you with information about caring for yourself after your procedure. Your health care provider may also give you more specific instructions. Your treatment has been planned according to current medical practices, but problems sometimes occur. Call your health care provider if you have any problems or questions after your procedure. What can I expect after the procedure? After your procedure, you may:  Feel sleepy for several hours.  Feel clumsy and have poor balance for several hours.  Feel forgetful about what happened after the procedure.  Have poor judgment for several hours.  Feel nauseous or vomit.  Have a sore throat if you had a breathing tube during the procedure. Follow these instructions at home: For at least 24 hours after the procedure:      Have a responsible adult stay with you. It is important to have someone help care for you until you are awake and alert.  Rest as needed.  Do not: ? Participate in activities in which you could fall or become injured. ? Drive. ? Use heavy machinery. ? Drink alcohol. ? Take sleeping pills or medicines that cause drowsiness. ? Make important decisions or sign legal documents. ? Take care  of children on your own. Eating and drinking  Follow the diet that is recommended by your health care provider.  If you vomit, drink water, juice, or soup when you can drink without vomiting.  Make sure you have little or no nausea before eating solid foods. General instructions  Take over-the-counter and prescription medicines only as told by your health care provider.  If you have sleep apnea, surgery and certain medicines can increase your risk for breathing problems. Follow instructions from your health care provider about wearing your sleep device: ? Anytime you are sleeping, including during daytime naps. ? While taking prescription pain medicines, sleeping medicines, or medicines that make you drowsy.  If you smoke, do not smoke without supervision.  Keep all follow-up visits as told by your health care provider. This is important. Contact a health  care provider if:  You keep feeling nauseous or you keep vomiting.  You feel light-headed.  You develop a rash.  You have a fever. Get help right away if:  You have trouble breathing. Summary  For several hours after your procedure, you may feel sleepy and have poor judgment.  Have a responsible adult stay with you for at least 24 hours or until you are awake and alert. This information is not intended to replace advice given to you by your health care provider. Make sure you discuss any questions you have with your health care provider. Document Revised: 09/20/2017 Document Reviewed: 10/13/2015 Elsevier Patient Education  [employer]. [phone]

## 2020-05-10 ENCOUNTER — Other Ambulatory Visit (HOSPITAL_COMMUNITY)
Admission: RE | Admit: 2020-05-10 | Discharge: 2020-05-10 | Disposition: A | Discharge status: Home / Self Care | Payer: Medicare Other | Source: Ambulatory Visit | Attending: Internal Medicine | Admitting: Internal Medicine

## 2020-05-10 ENCOUNTER — Encounter (HOSPITAL_COMMUNITY): Payer: Self-pay

## 2020-05-10 ENCOUNTER — Other Ambulatory Visit: Payer: Self-pay

## 2020-05-10 ENCOUNTER — Encounter (HOSPITAL_COMMUNITY)
Admission: RE | Admit: 2020-05-10 | Discharge: 2020-05-10 | Disposition: A | Discharge status: Home / Self Care | Payer: Medicare Other | Source: Ambulatory Visit | Attending: Internal Medicine | Admitting: Internal Medicine

## 2020-05-10 DIAGNOSIS — Z20822 Contact with and (suspected) exposure to covid-19: Secondary | ICD-10-CM | POA: Diagnosis not present

## 2020-05-10 DIAGNOSIS — Z01818 Encounter for other preprocedural examination: Secondary | ICD-10-CM | POA: Insufficient documentation

## 2020-05-10 LAB — SARS CORONAVIRUS 2 (TAT 6-24 HRS): SARS Coronavirus 2: NEGATIVE

## 2020-05-10 LAB — BASIC METABOLIC PANEL
Anion gap: 8 (ref 5–15)
BUN: 26 mg/dL — ABNORMAL HIGH (ref 8–23)
CO2: 26 mmol/L (ref 22–32)
Calcium: 9 mg/dL (ref 8.9–10.3)
Chloride: 104 mmol/L (ref 98–111)
Creatinine, Ser: 0.79 mg/dL (ref 0.44–1.00)
GFR, Estimated: >60 mL/min (ref >60)
Glucose, Bld: 76 mg/dL (ref 70–99)
Potassium: 4.3 mmol/L (ref 3.5–5.1)
Sodium: 138 mmol/L (ref 135–145)

## 2020-05-14 ENCOUNTER — Encounter (HOSPITAL_COMMUNITY)
Admission: RE | Disposition: A | Discharge status: Home / Self Care | Payer: Self-pay | Source: Home / Self Care | Attending: Internal Medicine

## 2020-05-14 ENCOUNTER — Encounter (HOSPITAL_COMMUNITY): Payer: Self-pay

## 2020-05-14 ENCOUNTER — Ambulatory Visit (HOSPITAL_COMMUNITY): Payer: Medicare Other | Admitting: Anesthesiology

## 2020-05-14 ENCOUNTER — Other Ambulatory Visit: Payer: Self-pay

## 2020-05-14 ENCOUNTER — Ambulatory Visit (HOSPITAL_COMMUNITY)
Admission: RE | Admit: 2020-05-14 | Discharge: 2020-05-14 | Disposition: A | Discharge status: Home / Self Care | Payer: Medicare Other | Attending: Internal Medicine | Admitting: Internal Medicine

## 2020-05-14 DIAGNOSIS — Z1211 Encounter for screening for malignant neoplasm of colon: Secondary | ICD-10-CM | POA: Diagnosis present

## 2020-05-14 DIAGNOSIS — Z800 Family history of malignant neoplasm of digestive organs: Secondary | ICD-10-CM | POA: Insufficient documentation

## 2020-05-14 DIAGNOSIS — I10 Essential (primary) hypertension: Secondary | ICD-10-CM | POA: Diagnosis not present

## 2020-05-14 DIAGNOSIS — K635 Polyp of colon: Secondary | ICD-10-CM

## 2020-05-14 DIAGNOSIS — D123 Benign neoplasm of transverse colon: Secondary | ICD-10-CM | POA: Insufficient documentation

## 2020-05-14 DIAGNOSIS — K648 Other hemorrhoids: Secondary | ICD-10-CM | POA: Diagnosis not present

## 2020-05-14 DIAGNOSIS — E7800 Pure hypercholesterolemia, unspecified: Secondary | ICD-10-CM | POA: Diagnosis not present

## 2020-05-14 DIAGNOSIS — Z79899 Other long term (current) drug therapy: Secondary | ICD-10-CM | POA: Diagnosis not present

## 2020-05-14 DIAGNOSIS — Z791 Long term (current) use of non-steroidal anti-inflammatories (NSAID): Secondary | ICD-10-CM | POA: Diagnosis not present

## 2020-05-14 DIAGNOSIS — E119 Type 2 diabetes mellitus without complications: Secondary | ICD-10-CM | POA: Diagnosis not present

## 2020-05-14 HISTORY — PX: COLONOSCOPY WITH PROPOFOL: SHX5780

## 2020-05-14 HISTORY — PX: POLYPECTOMY: SHX5525

## 2020-05-14 SURGERY — COLONOSCOPY WITH PROPOFOL
Anesthesia: General

## 2020-05-14 MED ORDER — LACTATED RINGERS IV SOLN: Freq: Once | INTRAVENOUS | Status: AC

## 2020-05-14 MED ORDER — LACTATED RINGERS IV SOLN: INTRAVENOUS | Status: DC | PRN

## 2020-05-14 MED ORDER — CHLORHEXIDINE GLUCONATE CLOTH 2 % EX PADS: 6.0000 | MEDICATED_PAD | Freq: Once | CUTANEOUS | Status: DC

## 2020-05-14 MED ORDER — PROPOFOL 500 MG/50ML IV EMUL
INTRAVENOUS | Status: DC | PRN
  Administered 2020-05-14: 100 ug/kg/min via INTRAVENOUS

## 2020-05-14 MED ORDER — PROPOFOL 10 MG/ML IV BOLUS
INTRAVENOUS | Status: DC | PRN
  Administered 2020-05-14 (×3): 50 mg via INTRAVENOUS

## 2020-05-14 NOTE — Discharge Instructions (Signed)
  Colonoscopy Discharge Instructions  Read the instructions outlined below and refer to this sheet in the next few weeks. These discharge instructions provide you with general information on caring for yourself after you leave the hospital. Your doctor may also give you specific instructions. While your treatment has been planned according to the most current medical practices available, unavoidable complications occasionally occur.   ACTIVITY  You may resume your regular activity, but move at a slower pace for the next 24 hours.   Take frequent rest periods for the next 24 hours.   Walking will help get rid of the air and reduce the bloated feeling in your belly (abdomen).   No driving for 24 hours (because of the medicine (anesthesia) used during the test).    Do not sign any important legal documents or operate any machinery for 24 hours (because of the anesthesia used during the test).  NUTRITION  Drink plenty of fluids.   You may resume your normal diet as instructed by your doctor.   Begin with a light meal and progress to your normal diet. Heavy or fried foods are harder to digest and may make you feel sick to your stomach (nauseated).   Avoid alcoholic beverages for 24 hours or as instructed.  MEDICATIONS  You may resume your normal medications unless your doctor tells you otherwise.  WHAT YOU CAN EXPECT TODAY  Some feelings of bloating in the abdomen.   Passage of more gas than usual.   Spotting of blood in your stool or on the toilet paper.  IF YOU HAD POLYPS REMOVED DURING THE COLONOSCOPY:  No aspirin products for 7 days or as instructed.   No alcohol for 7 days or as instructed.   Eat a soft diet for the next 24 hours.  FINDING OUT THE RESULTS OF YOUR TEST Not all test results are available during your visit. If your test results are not back during the visit, make an appointment with your caregiver to find out the results. Do not assume everything is normal if  you have not heard from your caregiver or the medical facility. It is important for you to follow up on all of your test results.  SEEK IMMEDIATE MEDICAL ATTENTION IF:  You have more than a spotting of blood in your stool.   Your belly is swollen (abdominal distention).   You are nauseated or vomiting.   You have a temperature over 101.   You have abdominal pain or discomfort that is severe or gets worse throughout the day.   Your colonoscopy revealed 1 polyp(s) which I removed successfully. Await pathology results, my office will contact you. I recommend repeating colonoscopy in 7 years for surveillance purposes.    I hope you have a great rest of your week!  Elon Alas. Abbey Chatters, D.O. Gastroenterology and Hepatology [facility]

## 2020-05-14 NOTE — H&P (Signed)
Primary Care Physician:  Lucia Gaskins, MD Primary Gastroenterologist:  Dr. Abbey Chatters  Pre-Procedure History & Physical: HPI:  Ashley Oneal is a 64 y.o. female is here for a colonoscopy for colon cancer screening purposes.  Notes her grandmother had colon cancer, unclear age of diagnosis.  No melena or hematochezia.  No abdominal pain or unintentional weight loss.  No change in bowel habits.  Overall feels well from a GI standpoint.  Past Medical History:  Diagnosis Date  . Arthritis   . Depression with anxiety   . Diabetes mellitus without complication (HCC)    borderline  . Hypercholesteremia   . Hypertension     Past Surgical History:  Procedure Laterality Date  . TUBAL LIGATION      Prior to Admission medications   Medication Sig Start Date End Date Taking? Authorizing Provider  acetaminophen (TYLENOL) 500 MG tablet Take 500 mg by mouth as needed.    Yes [provider]  amLODipine-benazepril (LOTREL) 10-20 MG per capsule Take 1 capsule by mouth daily.   Yes [provider]  Cholecalciferol (VITAMIN D3) 10 MCG (400 UNIT) CAPS Take by mouth daily.   Yes [provider]  diphenhydrAMINE (BENADRYL) 25 mg capsule Take 1 capsule (25 mg total) by mouth every 6 (six) hours as needed for itching. Patient taking differently: Take 25 mg by mouth as needed for itching.  11/07/16  Yes Idol, Almyra Free, PA-C  hydrOXYzine (ATARAX/VISTARIL) 25 MG tablet Take 25 mg by mouth 2 (two) times daily.   Yes [provider]  ibuprofen (ADVIL,MOTRIN) 600 MG tablet Take 1 tablet (600 mg total) by mouth every 6 (six) hours as needed. Patient taking differently: Take 600 mg by mouth as needed.  11/07/16  Yes IdolAlmyra Free, PA-C  Melatonin 10 MG TABS Take by mouth at bedtime.   Yes [provider]  meloxicam (MOBIC) 15 MG tablet Take 15 mg by mouth daily.   Yes [provider]  PARoxetine (PAXIL) 20 MG tablet Take 20 mg by mouth every morning. 02/16/20  Yes  [provider]  simvastatin (ZOCOR) 10 MG tablet Take 10 mg by mouth daily.   Yes [provider]  guaiFENesin-codeine 100-10 MG/5ML syrup Take 5 mLs by mouth 3 (three) times daily as needed for cough. Patient not taking: Reported on 03/21/2020 08/02/13   Carmin Muskrat, MD    Allergies as of 03/21/2020  . (No Known Allergies)    Family History  Problem Relation Age of Onset  . Colon cancer Maternal Grandmother        unknown age    Social History   Socioeconomic History  . Marital status: Divorced    Spouse name: Not on file  . Number of children: Not on file  . Years of education: Not on file  . Highest education level: Not on file  Occupational History  . Not on file  Tobacco Use  . Smoking status: Never Smoker  . Smokeless tobacco: Never Used  Vaping Use  . Vaping Use: Never used  Substance and Sexual Activity  . Alcohol use: Yes    Comment: 1 beer daily  . Drug use: No  . Sexual activity: Not on file  Other Topics Concern  . Not on file  Social History Narrative  . Not on file   Social Determinants of Health   Financial Resource Strain:   . Difficulty of Paying Living Expenses: Not on file  Food Insecurity:   . Worried About [employer] of  Food in the Last Year: Not on file  . Ran Out of Food in the Last Year: Not on file  Transportation Needs:   . Lack of Transportation (Medical): Not on file  . Lack of Transportation (Non-Medical): Not on file  Physical Activity:   . Days of Exercise per Week: Not on file  . Minutes of Exercise per Session: Not on file  Stress:   . Feeling of Stress : Not on file  Social Connections:   . Frequency of Communication with Friends and Family: Not on file  . Frequency of Social Gatherings with Friends and Family: Not on file  . Attends Religious Services: Not on file  . Active Member of Clubs or Organizations: Not on file  . Attends Archivist Meetings: Not on file  . Marital Status: Not on  file  Intimate Partner Violence:   . Fear of Current or Ex-Partner: Not on file  . Emotionally Abused: Not on file  . Physically Abused: Not on file  . Sexually Abused: Not on file    Review of Systems: See HPI, otherwise negative ROS  Impression/Plan: Ashley Oneal is here for a colonoscopy to be performed for colon cancer screening purposes.  The risks of the procedure including infection, bleed, or perforation as well as benefits, limitations, alternatives and imponderables have been reviewed with the patient. Questions have been answered. All parties agreeable.

## 2020-05-14 NOTE — Anesthesia Postprocedure Evaluation (Signed)
Anesthesia Post Note  Patient: Ashley Oneal  Procedure(s) Performed: COLONOSCOPY WITH PROPOFOL (N/A ) POLYPECTOMY  Patient location during evaluation: PACU Anesthesia Type: General Level of consciousness: awake and alert and oriented Pain management: pain level controlled Vital Signs Assessment: post-procedure vital signs reviewed and stable Respiratory status: spontaneous breathing Cardiovascular status: blood pressure returned to baseline Postop Assessment: no apparent nausea or vomiting Anesthetic complications: no   No complications documented.   Last Vitals:  Vitals:   05/14/20 0943 05/14/20 1057  BP: 111/69 106/84  Pulse: 96   Resp: (!) 21 (!) 22  Temp: 37.3 C (P) 36.8 C  SpO2: 99%     Last Pain:  Vitals:   05/14/20 1032  TempSrc:   PainSc: 0-No pain                 Karna Dupes

## 2020-05-14 NOTE — Transfer of Care (Signed)
Immediate Anesthesia Transfer of Care Note  Patient: Ashley Oneal  Procedure(s) Performed: COLONOSCOPY WITH PROPOFOL (N/A ) POLYPECTOMY  Patient Location: PACU  Anesthesia Type:General  Level of Consciousness: awake, alert  and oriented  Airway & Oxygen Therapy: Patient Spontanous Breathing  Post-op Assessment: Report given to RN and Post -op Vital signs reviewed and stable  Post vital signs: Reviewed and stable  Last Vitals:  Vitals Value Taken Time  BP 106/84 05/14/20 1056  Temp    Pulse    Resp 22 05/14/20 1056  SpO2    Vitals shown include unvalidated device data.  Last Pain:  Vitals:   05/14/20 1032  TempSrc:   PainSc: 0-No pain         Complications: No complications documented.

## 2020-05-14 NOTE — Anesthesia Preprocedure Evaluation (Signed)
Anesthesia Evaluation  Patient identified by MRN, date of birth, ID band Patient awake    Reviewed: Allergy & Precautions, NPO status , Patient's Chart, lab work & pertinent test results  History of Anesthesia Complications Negative for: history of anesthetic complications  Airway Mallampati: II  TM Distance: >3 FB Neck ROM: Full    Dental  (+) Dental Advisory Given, Edentulous Upper, Missing   Pulmonary neg pulmonary ROS,    Pulmonary exam normal breath sounds clear to auscultation       Cardiovascular hypertension, Pt. on medications Normal cardiovascular exam Rhythm:Regular Rate:Normal     Neuro/Psych negative neurological ROS     GI/Hepatic Neg liver ROS, Bowel prep,  Endo/Other  diabetes, Well Controlled, Type 2  Renal/GU negative Renal ROS  negative genitourinary   Musculoskeletal  (+) Arthritis ,   Abdominal   Peds  Hematology   Anesthesia Other Findings   Reproductive/Obstetrics                            Anesthesia Physical Anesthesia Plan  ASA: II  Anesthesia Plan: General   Post-op Pain Management:    Induction: Intravenous  PONV Risk Score and Plan: TIVA  Airway Management Planned: Nasal Cannula and Natural Airway  Additional Equipment:   Intra-op Plan:   Post-operative Plan:   Informed Consent: I have reviewed the patients History and Physical, chart, labs and discussed the procedure including the risks, benefits and alternatives for the proposed anesthesia with the patient or authorized representative who has indicated his/her understanding and acceptance.     Dental advisory given  Plan Discussed with: CRNA and Surgeon  Anesthesia Plan Comments:         Anesthesia Quick Evaluation

## 2020-05-14 NOTE — Op Note (Signed)
[facility] Patient Name: Ashley Oneal Procedure Date: 05/14/2020 9:55 AM MRN: [id number] Date of Birth: 03-13-1956 Attending MD: Elon Alas. Abbey Chatters DO CSN: [id number] Age: 64 Admit Type: Outpatient Procedure:                Colonoscopy Indications:              Screening for colorectal malignant neoplasm Providers:                Elon Alas. Jymir Dunaj, DO, Otis Peak B. Sharon Seller, RN,                            Randa Spike, Technician Referring MD:              Medicines:                See the Anesthesia note for documentation of the                            administered medications Complications:            No immediate complications. Estimated Blood Loss:     Estimated blood loss was minimal. Procedure:                Pre-Anesthesia Assessment:                           - The anesthesia plan was to use monitored                            anesthesia care (MAC).                           After obtaining informed consent, the colonoscope                            was passed under direct vision. Throughout the                            procedure, the patient's blood pressure, pulse, and                            oxygen saturations were monitored continuously. The                            [id number]-HQ190L ([id number]) scope was introduced through                            the anus and advanced to the the cecum, identified                            by appendiceal orifice and ileocecal valve. The                            colonoscopy was performed without difficulty. The                            patient tolerated the procedure  well. The quality                            of the bowel preparation was evaluated using the                            BBPS [facility] Bowel Preparation Scale) with scores                            of: Right Colon = 2 (minor amount of residual                            staining, small fragments of stool and/or opaque                            liquid, but  mucosa seen well), Transverse Colon = 2                            (minor amount of residual staining, small fragments                            of stool and/or opaque liquid, but mucosa seen                            well) and Left Colon = 2 (minor amount of residual                            staining, small fragments of stool and/or opaque                            liquid, but mucosa seen well). The total BBPS score                            equals 6. The quality of the bowel preparation was                            fair. Scope In: 10:37:17 AM Scope Out: 10:51:00 AM Scope Withdrawal Time: 0 hours 8 minutes 39 seconds  Total Procedure Duration: 0 hours 13 minutes 43 seconds  Findings:      The perianal and digital rectal examinations were normal.      Non-bleeding internal hemorrhoids were found during endoscopy.      A 2 mm polyp was found in the transverse colon. The polyp was sessile.       The polyp was removed with a jumbo cold forceps. Resection and retrieval       were complete.      The exam was otherwise without abnormality. Impression:               - Preparation of the colon was fair.                           - Non-bleeding internal hemorrhoids.                           -  One 2 mm polyp in the transverse colon, removed                            with a jumbo cold forceps. Resected and retrieved.                           - The examination was otherwise normal. Moderate Sedation:      Per Anesthesia Care Recommendation:           - Patient has a contact number available for                            emergencies. The signs and symptoms of potential                            delayed complications were discussed with the                            patient. Return to normal activities tomorrow.                            Written discharge instructions were provided to the                            patient.                           - Resume previous diet.                            - Continue present medications.                           - Await pathology results.                           - Repeat colonoscopy in 7 years for surveillance.                           - Return to GI clinic PRN. Procedure Code(s):        --- Professional ---                           [phone], Colonoscopy, flexible; with biopsy, single                            or multiple Diagnosis Code(s):        --- Professional ---                           Z12.11, Encounter for screening for malignant                            neoplasm of colon                           K64.8, Other hemorrhoids  K63.5, Polyp of colon CPT copyright 2019 American Medical Association. All rights reserved. The codes documented in this report are preliminary and upon coder review may  be revised to meet current compliance requirements. Elon Alas. Abbey Chatters, DO [place] Abbey Chatters, DO 05/14/2020 10:54:38 AM This report has been signed electronically. Number of Addenda: 0

## 2020-05-15 LAB — SURGICAL PATHOLOGY

## 2020-05-20 ENCOUNTER — Encounter (HOSPITAL_COMMUNITY): Payer: Self-pay | Admitting: Internal Medicine

## 2020-10-01 DIAGNOSIS — E559 Vitamin D deficiency, unspecified: Secondary | ICD-10-CM | POA: Diagnosis not present

## 2020-10-01 DIAGNOSIS — K219 Gastro-esophageal reflux disease without esophagitis: Secondary | ICD-10-CM | POA: Diagnosis not present

## 2020-10-01 DIAGNOSIS — I110 Hypertensive heart disease with heart failure: Secondary | ICD-10-CM | POA: Diagnosis not present

## 2020-10-09 IMAGING — MG DIGITAL SCREENING BILAT W/ TOMO W/ CAD
8 series · 8 of 24 positions shown · non-contrast
Comparison: 09/04/2008.

CLINICAL DATA: Screening.

EXAM:
DIGITAL SCREENING BILATERAL MAMMOGRAM WITH TOMO AND CAD

[R MLO synth-2D]
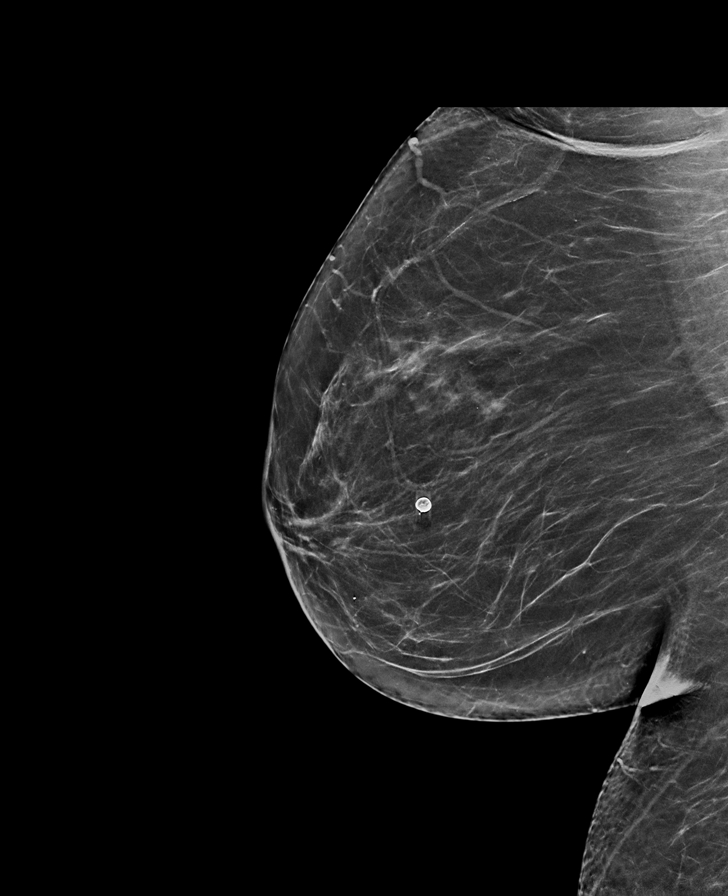

[R CC synth-2D]
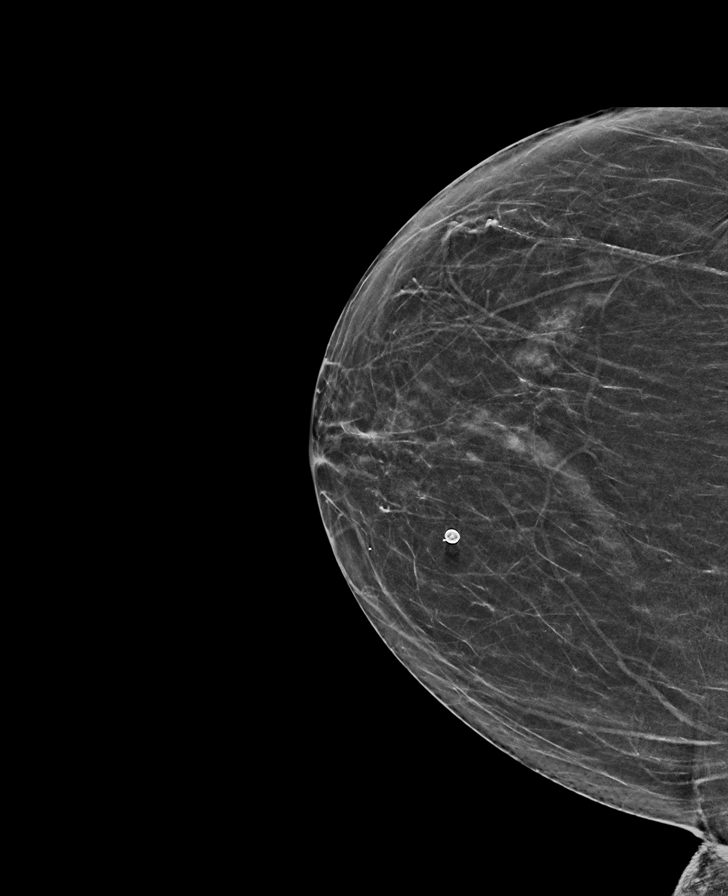

[L MLO synth-2D]
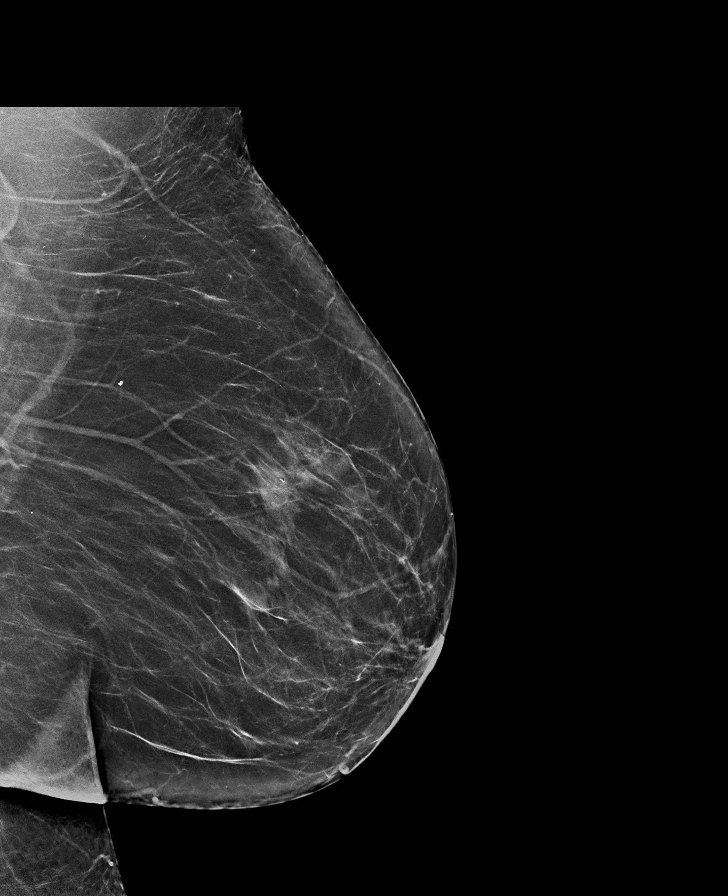

[L CC synth-2D]
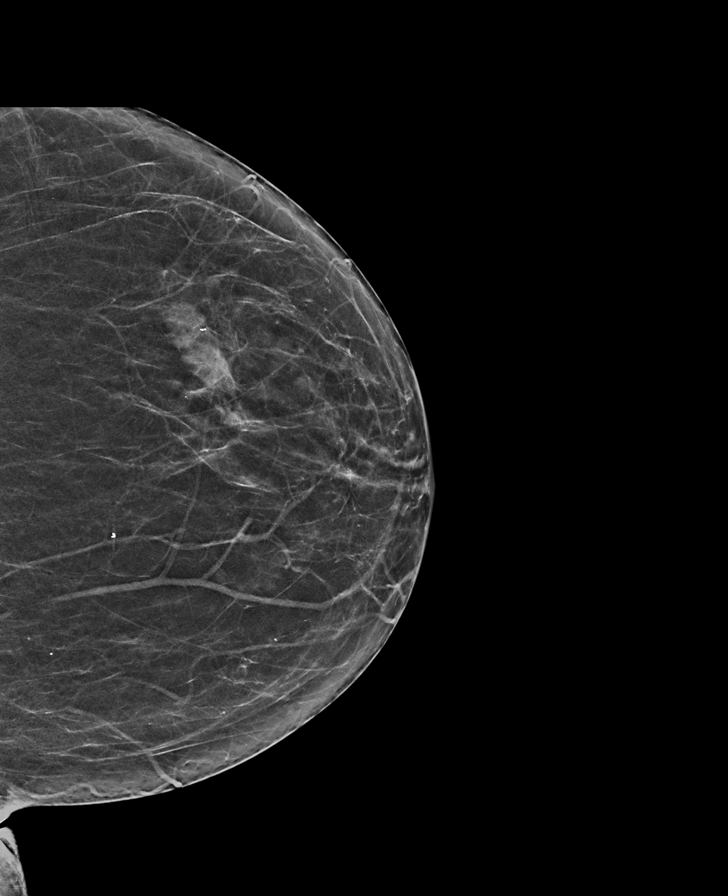

[R MLO tomo · tomo slice 35/68.0]
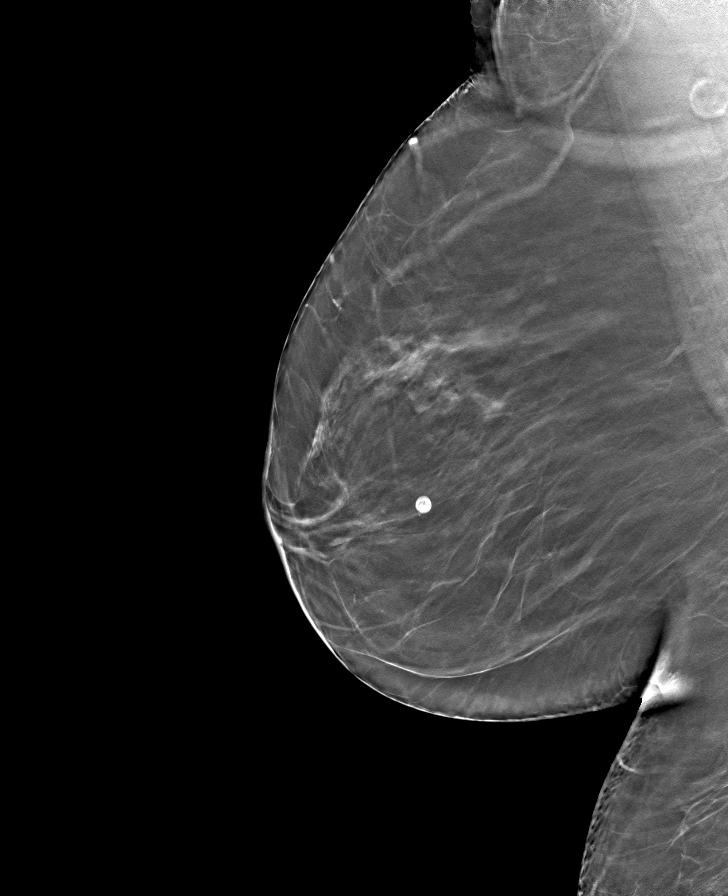

[L CC tomo · tomo slice 29/57.0]
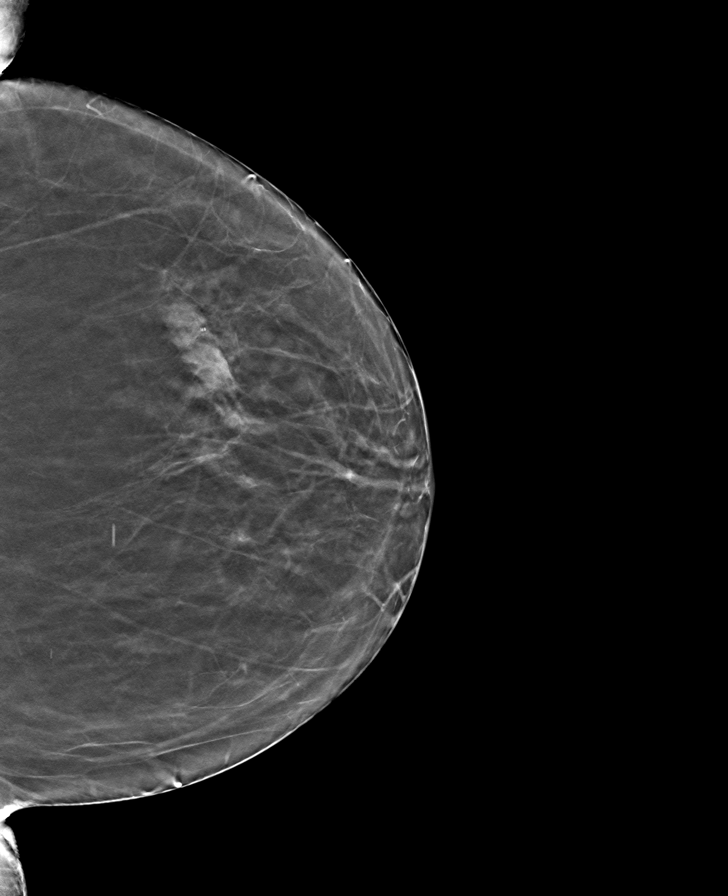

[L MLO tomo · tomo slice 36/71.0]
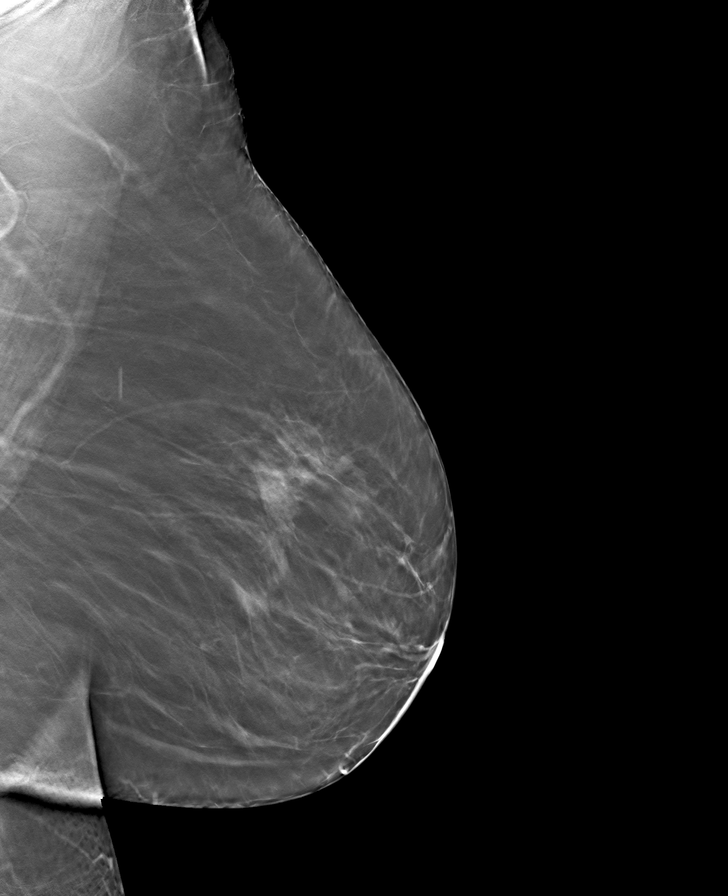

[R CC tomo · tomo slice 28/55.0]
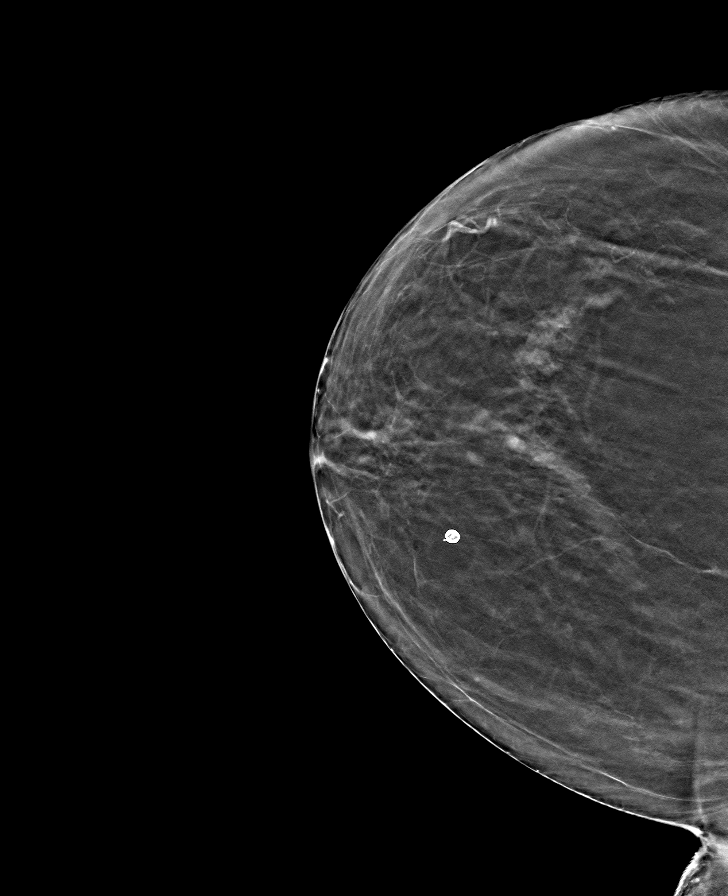

[8 of 24 positions shown; findings below may reference images not displayed]

ACR Breast Density Category b: There are scattered areas of
fibroglandular density.
FINDINGS: There are no findings suspicious for malignancy. Images were
processed with CAD.
IMPRESSION: No mammographic evidence of malignancy. A result letter of this
screening mammogram will be mailed directly to the patient.

RECOMMENDATION:
Screening mammogram in one year. (Code:D8-Y-385)

BI-RADS CATEGORY  1: Negative.

## 2020-12-31 DIAGNOSIS — E7849 Other hyperlipidemia: Secondary | ICD-10-CM | POA: Diagnosis not present

## 2020-12-31 DIAGNOSIS — K219 Gastro-esophageal reflux disease without esophagitis: Secondary | ICD-10-CM | POA: Diagnosis not present

## 2020-12-31 DIAGNOSIS — M2559 Pain in other specified joint: Secondary | ICD-10-CM | POA: Diagnosis not present

## 2022-04-03 ENCOUNTER — Other Ambulatory Visit: Payer: Self-pay | Admitting: Internal Medicine

## 2022-04-03 ENCOUNTER — Other Ambulatory Visit (HOSPITAL_COMMUNITY): Payer: Self-pay | Admitting: Internal Medicine

## 2022-04-03 DIAGNOSIS — I10 Essential (primary) hypertension: Secondary | ICD-10-CM

## 2022-04-03 DIAGNOSIS — Z0001 Encounter for general adult medical examination with abnormal findings: Secondary | ICD-10-CM

## 2022-04-28 ENCOUNTER — Ambulatory Visit (HOSPITAL_COMMUNITY)
Admission: RE | Admit: 2022-04-28 | Discharge: 2022-04-28 | Disposition: A | Discharge status: Home / Self Care | Payer: Medicare Other | Source: Ambulatory Visit | Attending: Internal Medicine | Admitting: Internal Medicine

## 2022-04-28 DIAGNOSIS — Z8249 Family history of ischemic heart disease and other diseases of the circulatory system: Secondary | ICD-10-CM | POA: Insufficient documentation

## 2022-04-28 DIAGNOSIS — E785 Hyperlipidemia, unspecified: Secondary | ICD-10-CM | POA: Diagnosis not present

## 2022-04-28 DIAGNOSIS — I10 Essential (primary) hypertension: Secondary | ICD-10-CM

## 2022-04-28 DIAGNOSIS — I6523 Occlusion and stenosis of bilateral carotid arteries: Secondary | ICD-10-CM | POA: Diagnosis not present

## 2022-04-28 DIAGNOSIS — Z0001 Encounter for general adult medical examination with abnormal findings: Secondary | ICD-10-CM | POA: Insufficient documentation

## 2023-06-15 ENCOUNTER — Other Ambulatory Visit (HOSPITAL_COMMUNITY): Payer: Self-pay | Admitting: Internal Medicine

## 2023-06-15 DIAGNOSIS — I10 Essential (primary) hypertension: Secondary | ICD-10-CM

## 2023-06-23 ENCOUNTER — Ambulatory Visit (HOSPITAL_COMMUNITY)
Admission: RE | Admit: 2023-06-23 | Discharge: 2023-06-23 | Disposition: A | Discharge status: Home / Self Care | Payer: 59 | Source: Ambulatory Visit | Attending: Family Medicine | Admitting: Family Medicine

## 2023-06-23 ENCOUNTER — Other Ambulatory Visit (HOSPITAL_COMMUNITY): Payer: Self-pay | Admitting: Family Medicine

## 2023-06-23 DIAGNOSIS — M25562 Pain in left knee: Secondary | ICD-10-CM

## 2023-11-22 ENCOUNTER — Ambulatory Visit (HOSPITAL_COMMUNITY)
Admission: RE | Admit: 2023-11-22 | Discharge: 2023-11-22 | Disposition: A | Discharge status: Home / Self Care | Source: Ambulatory Visit | Attending: Internal Medicine | Admitting: Internal Medicine

## 2023-11-22 DIAGNOSIS — I10 Essential (primary) hypertension: Secondary | ICD-10-CM

## 2024-07-26 ENCOUNTER — Other Ambulatory Visit (HOSPITAL_COMMUNITY): Payer: Self-pay | Admitting: Family Medicine

## 2024-07-26 DIAGNOSIS — R748 Abnormal levels of other serum enzymes: Secondary | ICD-10-CM

## 2024-08-04 ENCOUNTER — Ambulatory Visit (HOSPITAL_COMMUNITY)
Admission: RE | Admit: 2024-08-04 | Discharge: 2024-08-04 | Disposition: A | Source: Ambulatory Visit | Attending: Family Medicine | Admitting: Family Medicine

## 2024-08-04 DIAGNOSIS — R748 Abnormal levels of other serum enzymes: Secondary | ICD-10-CM | POA: Insufficient documentation

## 2024-08-22 ENCOUNTER — Ambulatory Visit: Admitting: Orthopedic Surgery

## 2024-08-23 ENCOUNTER — Ambulatory Visit: Admitting: Orthopedic Surgery
